# Patient Record
Sex: Male | Born: 2000 | Race: White | Hispanic: No | Marital: Single | Smoking: Never smoker
Health system: Southern US, Community
[De-identification: ages and names within clinical notes are randomized; demographics above are authoritative.]

## PROBLEM LIST (undated history)

## (undated) DIAGNOSIS — Z789 Other specified health status: Secondary | ICD-10-CM

---
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
## 2021-04-23 ENCOUNTER — Emergency Department (HOSPITAL_BASED_OUTPATIENT_CLINIC_OR_DEPARTMENT_OTHER)
Admission: EM | Admit: 2021-04-23 | Discharge: 2021-04-23 | Disposition: A | Discharge status: Home / Self Care | Payer: Commercial Managed Care - PPO | Attending: Emergency Medicine | Admitting: Emergency Medicine

## 2021-04-23 ENCOUNTER — Encounter (HOSPITAL_BASED_OUTPATIENT_CLINIC_OR_DEPARTMENT_OTHER): Payer: Self-pay

## 2021-04-23 ENCOUNTER — Emergency Department (HOSPITAL_BASED_OUTPATIENT_CLINIC_OR_DEPARTMENT_OTHER): Payer: Commercial Managed Care - PPO

## 2021-04-23 ENCOUNTER — Other Ambulatory Visit: Payer: Self-pay

## 2021-04-23 DIAGNOSIS — S83005A Unspecified dislocation of left patella, initial encounter: Secondary | ICD-10-CM | POA: Diagnosis not present

## 2021-04-23 DIAGNOSIS — M25562 Pain in left knee: Secondary | ICD-10-CM | POA: Diagnosis not present

## 2021-04-23 DIAGNOSIS — M25462 Effusion, left knee: Secondary | ICD-10-CM

## 2021-04-23 DIAGNOSIS — Y9355 Activity, bike riding: Secondary | ICD-10-CM | POA: Insufficient documentation

## 2021-04-23 DIAGNOSIS — Y92410 Unspecified street and highway as the place of occurrence of the external cause: Secondary | ICD-10-CM | POA: Diagnosis not present

## 2021-04-23 DIAGNOSIS — S8992XA Unspecified injury of left lower leg, initial encounter: Secondary | ICD-10-CM | POA: Diagnosis present

## 2021-04-23 MED ORDER — FENTANYL CITRATE (PF) 100 MCG/2ML IJ SOLN
INTRAMUSCULAR | Status: AC
  Administered 2021-04-23: 50 ug via INTRAVENOUS
  Filled 2021-04-23: qty 2

## 2021-04-23 MED ORDER — FENTANYL CITRATE (PF) 100 MCG/2ML IJ SOLN: 50.0000 ug | Freq: Once | INTRAMUSCULAR | Status: AC
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
## 2021-04-23 NOTE — Discharge Instructions (Addendum)
Please use knee immobilizer.  Please use Tylenol or ibuprofen for pain.  You may use 600 mg ibuprofen every 6 hours or 1000 mg of Tylenol every 6 hours.  You may choose to alternate between the 2.  This would be most effective.  Not to exceed 4 g of Tylenol within 24 hours.  Not to exceed 3200 mg ibuprofen 24 hours.   Please call Dr. Eliberto Ivory office and make an appointment within the next week.  Rest ice and elevate
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
## 2021-04-30 ENCOUNTER — Other Ambulatory Visit: Payer: Self-pay

## 2021-04-30 ENCOUNTER — Ambulatory Visit: Payer: Self-pay

## 2021-04-30 ENCOUNTER — Ambulatory Visit: Payer: Commercial Managed Care - PPO | Admitting: Orthopaedic Surgery

## 2021-04-30 DIAGNOSIS — M25462 Effusion, left knee: Secondary | ICD-10-CM | POA: Diagnosis not present

## 2021-04-30 DIAGNOSIS — S83005D Unspecified dislocation of left patella, subsequent encounter: Secondary | ICD-10-CM

## 2021-04-30 DIAGNOSIS — M25562 Pain in left knee: Secondary | ICD-10-CM
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
## 2021-05-15 ENCOUNTER — Ambulatory Visit: Payer: Commercial Managed Care - PPO | Admitting: Physician Assistant

## 2021-05-15 DIAGNOSIS — S83005D Unspecified dislocation of left patella, subsequent encounter: Secondary | ICD-10-CM | POA: Diagnosis not present

## 2021-05-15 NOTE — Progress Notes (Signed)
   Office Visit Note   Patient: [name]           Date of Birth: 12/12/2000           MRN: [id number] Visit Date: 05/15/2021              Requested by: Loyola Mast, MD [address] [place],  [place] [address] PCP: Loyola Mast, MD   Assessment & Plan: Visit Diagnoses:  1. Patellar dislocation, left, subsequent encounter     Plan: We will send him to formal physical therapy for quad strengthening.  He is also placed in a patella stabilizing knee brace.  He is wearing his brace whenever he is up.  Knee was aspirated today in total 21cc was changed aspirate obtained patient tolerated well.  Follow-Up Instructions: Return in about 4 weeks (around 06/12/2021).   Orders:  Orders Placed This Encounter  Procedures   Ambulatory referral to Physical Therapy   No orders of the defined types were placed in this encounter.     Procedures: No procedures performed   Clinical Data: No additional findings.   Subjective: Chief Complaint  Patient presents with   Left Leg - Follow-up    HPI Leam returns today for follow-up of his left knee patellar dislocation.  He has been wearing a knee immobilizer with his dad he is company today states he still been becoming more more short of the knee and has not been using the knee brace as much.  Still has some discomfort in the knee.  He has had no additional dislocations in the knee.  Review of Systems See HPI  Objective: Vital Signs: There were no vitals taken for this visit.  Physical Exam Constitutional:      Appearance: He is not ill-appearing or diaphoretic.  Pulmonary:     Effort: Pulmonary effort is normal.  Neurological:     Mental Status: He is alert and oriented to person, place, and time.  Psychiatric:        Mood and Affect: Mood normal.    Ortho Exam Left knee slight effusion.  No abnormal warmth.  No gross instability valgus varus stressing anterior drawer is negative.  Apprehension test is negative.  Full  extension.  Able to do straight leg raise but definite quad weakness. Specialty Comments:  No specialty comments available.  Imaging: No results found.   PMFS History: Patient Active Problem List   Diagnosis Date Noted   Retained orthopedic hardware right femur 08/15/2013   Fracture, right femur closed, shaft 01/13/2013   Past Medical History:  Diagnosis Date   Medical history non-contributory     Family History  Problem Relation Age of Onset   Hypertension Father    COPD Father     Past Surgical History:  Procedure Laterality Date   HARDWARE REMOVAL Right 08/15/2013   Procedure: REMOVAL OF ROD AND SCREWS RIGHT FEMUR;  Surgeon: Kathryne Hitch, MD;  Location: [facility] OR;  Service: Orthopedics;  Laterality: Right;   ORIF FEMUR FRACTURE Right 01/13/2013   Procedure: OPEN REDUCTION INTERNAL FIXATION Femur Fracture;  Surgeon: Kathryne Hitch, MD;  Location: [facility] OR;  Service: Orthopedics;  Laterality: Right;   Social History   Occupational History   Not on file  Tobacco Use   Smoking status: Never   Smokeless tobacco: Never  Substance and Sexual Activity   Alcohol use: No   Drug use: No   Sexual activity: Never

## 2021-06-04 ENCOUNTER — Other Ambulatory Visit: Payer: Self-pay

## 2021-06-04 ENCOUNTER — Ambulatory Visit: Payer: Commercial Managed Care - PPO | Attending: Physician Assistant | Admitting: Physical Therapy

## 2021-06-04 ENCOUNTER — Encounter: Payer: Self-pay | Admitting: Physical Therapy

## 2021-06-04 DIAGNOSIS — R252 Cramp and spasm: Secondary | ICD-10-CM | POA: Diagnosis present

## 2021-06-04 DIAGNOSIS — M25562 Pain in left knee: Secondary | ICD-10-CM | POA: Diagnosis not present

## 2021-06-04 DIAGNOSIS — R600 Localized edema: Secondary | ICD-10-CM | POA: Diagnosis present

## 2021-06-04 DIAGNOSIS — R262 Difficulty in walking, not elsewhere classified: Secondary | ICD-10-CM | POA: Insufficient documentation

## 2021-06-04 DIAGNOSIS — M6281 Muscle weakness (generalized): Secondary | ICD-10-CM | POA: Diagnosis present

## 2021-06-04 DIAGNOSIS — M25662 Stiffness of left knee, not elsewhere classified: Secondary | ICD-10-CM | POA: Diagnosis present

## 2021-06-04 NOTE — Patient Instructions (Signed)
Access Code: [email or web address] URL: [email or web address] Date: 06/04/2021 Prepared by: Harrie Foreman  Exercises Supine Quad Set - 1 x daily - 7 x weekly - 3 sets - 10 reps Active Straight Leg Raise with Quad Set - 1 x daily - 7 x weekly - 3 sets - 10 reps Seated Quad Set - 1 x daily - 7 x weekly - 3 sets - 10 reps Prone Hamstring Curl with Ankle Weight Table - 1 x daily - 7 x weekly - 3 sets - 10 reps Seated Hamstring Stretch - 1 x daily - 7 x weekly - 3 sets - 10 reps Standing Heel Raise with Support - 1 x daily - 7 x weekly - 3 sets - 10 reps
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
## 2021-06-06 ENCOUNTER — Ambulatory Visit: Payer: Commercial Managed Care - PPO

## 2021-06-06 ENCOUNTER — Other Ambulatory Visit: Payer: Self-pay

## 2021-06-06 DIAGNOSIS — R252 Cramp and spasm: Secondary | ICD-10-CM

## 2021-06-06 DIAGNOSIS — M25662 Stiffness of left knee, not elsewhere classified: Secondary | ICD-10-CM

## 2021-06-06 DIAGNOSIS — R262 Difficulty in walking, not elsewhere classified: Secondary | ICD-10-CM

## 2021-06-06 DIAGNOSIS — M25562 Pain in left knee: Secondary | ICD-10-CM | POA: Diagnosis not present

## 2021-06-06 DIAGNOSIS — M6281 Muscle weakness (generalized): Secondary | ICD-10-CM

## 2021-06-06 DIAGNOSIS — R600 Localized edema: Secondary | ICD-10-CM

## 2021-06-06 NOTE — Therapy (Signed)
[facility] [address]  [address] [place], [place], [address] Phone: [phone]   Fax:  [phone]  Physical Therapy Treatment  Patient Details  Name: [name] MRN: [id number] Date of Birth: 04-22-2001 Referring Provider (PT): Richardean Canal   Encounter Date: 06/06/2021   PT End of Session - 06/06/21 1024     Visit Number 2    Number of Visits 12    Date for PT Re-Evaluation 07/16/21    PT Start Time 0933    PT Stop Time 1013    PT Time Calculation (min) 40 min    Equipment Utilized During Treatment Left knee immobilizer    Activity Tolerance Patient tolerated treatment well    Behavior During Therapy [facility] for tasks assessed/performed             Past Medical History:  Diagnosis Date   Medical history non-contributory     Past Surgical History:  Procedure Laterality Date   HARDWARE REMOVAL Right 08/15/2013   Procedure: REMOVAL OF ROD AND SCREWS RIGHT FEMUR;  Surgeon: Kathryne Hitch, MD;  Location: [facility] OR;  Service: Orthopedics;  Laterality: Right;   ORIF FEMUR FRACTURE Right 01/13/2013   Procedure: OPEN REDUCTION INTERNAL FIXATION Femur Fracture;  Surgeon: Kathryne Hitch, MD;  Location: [facility] OR;  Service: Orthopedics;  Laterality: Right;    There were no vitals filed for this visit.   Subjective Assessment - 06/06/21 0935     Subjective Pt reports that he has been wearing his brace at all times, notes occasional aches in his knee in shower but overall no complaints so far.    Pertinent History history R femur fx 2014    Patient Stated Goals get the knee back to 100%    Currently in Pain? No/denies                               [facility] Adult PT Treatment/Exercise - 06/06/21 0001       Exercises   Exercises Knee/Hip      Knee/Hip Exercises: Stretches   Active Hamstring Stretch Left;2 reps;30 seconds    Active Hamstring Stretch Limitations seated    Quad Stretch  Left;2 reps;30 seconds    Quad Stretch Limitations supine with strap      Knee/Hip Exercises: Aerobic   Recumbent Bike L1x80min      Knee/Hip Exercises: Standing   Heel Raises Both;10 reps    Hip Abduction Stengthening;Both;10 reps    Abduction Limitations ski pole assist    Hip Extension Stengthening;Both;10 reps    Extension Limitations ski pole assist      Knee/Hip Exercises: Seated   Long Arc Quad AROM;15 reps    Long Arc Quad Limitations with ankle pump to decrease swelling      Knee/Hip Exercises: Supine   Quad Sets Strengthening;Left;10 reps    Quad Sets Limitations towel under heel    Straight Leg Raises Strengthening;Left;10 reps    Straight Leg Raises Limitations cues for QS with each rep      Knee/Hip Exercises: Prone   Hamstring Curl 10 reps    Hamstring Curl Limitations with red TB                     PT Education - 06/06/21 1017     Education Details HEP update: Access Code: [email or web address]    Person(s) Educated Patient    Methods Explanation;Demonstration;Handout  Comprehension Verbalized understanding;Returned demonstration              PT Short Term Goals - 06/06/21 1156       PT SHORT TERM GOAL #1   Title independent with initial HEP    Time 2    Period Weeks    Status On-going    Target Date 06/18/21               PT Long Term Goals - 06/06/21 1156       PT LONG TERM GOAL #1   Title Independent with progressed HEP to improve outcomes.    Time 6    Period Weeks    Status On-going      PT LONG TERM GOAL #2   Title Patient will tolerate walking for 30 minutes without complaint of L knee pain.    Baseline increased pain 10 min walking    Time 6    Period Weeks    Status On-going      PT LONG TERM GOAL #3   Title Pt. will demonstrated full L knee ROM = R knee ROM (0-145) to improve gait mechanics.    Baseline 5-130    Time 6    Period Weeks    Status On-going      PT LONG TERM GOAL #4   Title Patient will be able to  return to all activities without limitation.    Time 6    Period Weeks    Status On-going                   Plan - 06/06/21 1025     Clinical Impression Statement Pt had no complaints with the exercises today. Showed a good demonstration of the exercises with intermittent cues given for form and technique. He demonstrated some L quad tightness so added supine quad stretch to HEP. He reported that his exercises were a bit challenging for him so didn't update any strengthening exercises for now. He still wears his brace at all times and denies any episodes of pain or patellar dislocation.    PT Frequency 2x / week    PT Duration 6 weeks    PT Treatment/Interventions ADLs/Self Care Home Management;Cryotherapy;Electrical Stimulation;Iontophoresis 4mg /ml Dexamethasone;Moist Heat;Gait training;Functional mobility training;Therapeutic activities;Therapeutic exercise;Neuromuscular re-education;Manual techniques;Dry needling;Taping;Vasopneumatic Device;Passive range of motion;Joint Manipulations    PT Next Visit Plan review and progress HEP standing exercises    PT Home Exercise Plan Access Code:    Consulted and Agree with Plan of Care Patient             Patient will benefit from skilled therapeutic intervention in order to improve the following deficits and impairments:  Decreased endurance, Decreased range of motion, Decreased strength, Pain, Impaired flexibility, Difficulty walking  Visit Diagnosis: Acute pain of left knee  Stiffness of left knee, not elsewhere classified  Cramp and spasm  Difficulty in walking, not elsewhere classified  Localized edema  Muscle weakness (generalized)     Problem List Patient Active Problem List   Diagnosis Date Noted   Patellar dislocation, left, subsequent encounter 05/15/2021   Retained orthopedic hardware right femur 08/15/2013   Fracture, right femur closed, shaft 01/13/2013    01/15/2013, PTA 06/06/2021, 11:57  AM  [facility] [address]  [address] [place], [place], [place] Phone: [phone]   Fax:  [phone]  Name: [name] MRN: Nickolas Madrid Date of Birth: [birth year]

## 2021-06-10 ENCOUNTER — Other Ambulatory Visit: Payer: Self-pay

## 2021-06-10 ENCOUNTER — Ambulatory Visit: Payer: Commercial Managed Care - PPO | Admitting: Physical Therapy

## 2021-06-10 ENCOUNTER — Encounter: Payer: Self-pay | Admitting: Physical Therapy

## 2021-06-10 DIAGNOSIS — M25662 Stiffness of left knee, not elsewhere classified: Secondary | ICD-10-CM

## 2021-06-10 DIAGNOSIS — M6281 Muscle weakness (generalized): Secondary | ICD-10-CM

## 2021-06-10 DIAGNOSIS — M25562 Pain in left knee: Secondary | ICD-10-CM

## 2021-06-10 DIAGNOSIS — R600 Localized edema: Secondary | ICD-10-CM

## 2021-06-10 DIAGNOSIS — R252 Cramp and spasm: Secondary | ICD-10-CM

## 2021-06-10 DIAGNOSIS — R262 Difficulty in walking, not elsewhere classified: Secondary | ICD-10-CM

## 2021-06-10 NOTE — Patient Instructions (Signed)
Access Code: [email or web address] URL: [email or web address] Date: 06/10/2021 Prepared by: Harrie Foreman  Exercises Supine Bridge with [employer] Between Knees - 1 x daily - 7 x weekly - 3 sets - 10 reps Sidelying Hip Abduction - 1 x daily - 7 x weekly - 3 sets - 10 reps Prone Hip Extension with Plantarflexion - 1 x daily - 7 x weekly - 3 sets - 10 reps Supine Active Straight Leg Raise - 1 x daily - 7 x weekly - 3 sets - 10 reps Seated Hamstring Stretch - 2 x daily - 7 x weekly - 1 sets - 3 reps - 30 sec hold

## 2021-06-10 NOTE — Therapy (Signed)
[facility] [address]  [place], [place], [address] Phone: [phone]   Fax:  [phone]  Physical Therapy Treatment  Patient Details  Name: [name] MRN: [id number] Date of Birth: [birth year] Referring Provider (PT): Erskine Emery   Encounter Date: 06/10/2021   PT End of Session - 06/10/21 1800     Visit Number 3    Number of Visits 12    Date for PT Re-Evaluation 07/16/21    PT Start Time 1620    PT Stop Time 1700    PT Time Calculation (min) 40 min    Activity Tolerance Patient tolerated treatment well    Behavior During Therapy [facility] for tasks assessed/performed             Past Medical History:  Diagnosis Date   Medical history non-contributory     Past Surgical History:  Procedure Laterality Date   HARDWARE REMOVAL Right 08/15/2013   Procedure: REMOVAL OF ROD AND SCREWS RIGHT FEMUR;  Surgeon: Mcarthur Rossetti, MD;  Location: [place];  Service: Orthopedics;  Laterality: Right;   ORIF FEMUR FRACTURE Right 01/13/2013   Procedure: OPEN REDUCTION INTERNAL FIXATION Femur Fracture;  Surgeon: Mcarthur Rossetti, MD;  Location: [place];  Service: Orthopedics;  Laterality: Right;    There were no vitals filed for this visit.   Subjective Assessment - 06/10/21 1623     Subjective Pt reports that is compliant with wearing brace, but did not wear today to therapy since didn't want to bother taking it on and off.    Pertinent History history R femur fx 2014    Patient Stated Goals get the knee back to 100%    Currently in Pain? No/denies                               [facility] Adult PT Treatment/Exercise - 06/10/21 0001       Exercises   Exercises Knee/Hip      Knee/Hip Exercises: Aerobic   Nustep x 6 min      Knee/Hip Exercises: Standing   Hip Abduction Stengthening;Both;20 reps;Knee straight    Abduction Limitations ski pole assist    Hip Extension  Stengthening;Both;15 reps;Knee straight    Extension Limitations ski pole assist    Forward Step Up 2 sets;10 reps;Hand Hold: 1;Step Height: 2";Both    Step Down 10 reps;Hand Hold: 1;Step Height: 2";Both    Step Down Limitations dips      Knee/Hip Exercises: Seated   Long Arc Quad Strengthening;Left;10 reps    Other Seated Knee/Hip Exercises single leg raise with quad set 2 x 10 LLE - challenging    Other Seated Knee/Hip Exercises seated hamstring stretch 2 x 30 sec bil      Knee/Hip Exercises: Supine   Straight Leg Raises Strengthening;Left;10 reps;2 sets    Straight Leg Raises Limitations cues for QS with each rep      Knee/Hip Exercises: Sidelying   Hip ABduction Strengthening;Left;2 sets;10 reps      Knee/Hip Exercises: Prone   Hip Extension Strengthening;Both;2 sets;10 reps                     PT Education - 06/10/21 1700     Education Details HEP update    Person(s) Educated Patient    Methods Explanation;Demonstration;Verbal cues;Handout    Comprehension Verbalized understanding;Returned demonstration  PT Short Term Goals - 06/10/21 1802       PT SHORT TERM GOAL #1   Title independent with initial HEP    Time 2    Period Weeks    Status Achieved   06/10/21- met for current   Target Date 06/18/21               PT Long Term Goals - 06/10/21 1803       PT LONG TERM GOAL #1   Title Independent with progressed HEP to improve outcomes.    Time 6    Period Weeks    Status On-going      PT LONG TERM GOAL #2   Title Patient will tolerate walking for 30 minutes without complaint of L knee pain.    Baseline increased pain 10 min walking    Time 6    Period Weeks    Status On-going      PT LONG TERM GOAL #3   Title Pt. will demonstrated full L knee ROM = R knee ROM (0-145) to improve gait mechanics.    Baseline 5-130    Time 6    Period Weeks    Status On-going   06/10/18- full extension, still 130 deg flexion L knee     PT  LONG TERM GOAL #4   Title Patient will be able to return to all activities without limitation.    Time 6    Period Weeks    Status On-going                   Plan - 06/10/21 1800     Clinical Impression Statement Patient is making good progress and reporting no knee pain with exercises.  He reports good compliance with current exercises and feels they are much easier.  He demonstrates improved L quad strength with no knee lag with LAQ, however obviously fatigued quickly with SLR both supine and sitting.  Progressed HEP today to continue progressive knee/hip strengthening.  He would benefit from continued skilled therapy.    PT Frequency 2x / week    PT Duration 6 weeks    PT Treatment/Interventions ADLs/Self Care Home Management;Cryotherapy;Electrical Stimulation;Iontophoresis 4mg /ml Dexamethasone;Moist Heat;Gait training;Functional mobility training;Therapeutic activities;Therapeutic exercise;Neuromuscular re-education;Manual techniques;Dry needling;Taping;Vasopneumatic Device;Passive range of motion;Joint Manipulations    PT Next Visit Plan review and progress HEP standing exercises    PT Home Exercise Plan Access Code: [email or web address]    Consulted and Agree with Plan of Care Patient             Patient will benefit from skilled therapeutic intervention in order to improve the following deficits and impairments:  Decreased endurance, Decreased range of motion, Decreased strength, Pain, Impaired flexibility, Difficulty walking  Visit Diagnosis: Acute pain of left knee  Stiffness of left knee, not elsewhere classified  Cramp and spasm  Difficulty in walking, not elsewhere classified  Localized edema  Muscle weakness (generalized)     Problem List Patient Active Problem List   Diagnosis Date Noted   Patellar dislocation, left, subsequent encounter 05/15/2021   Retained orthopedic hardware right femur 08/15/2013   Fracture, right femur closed, shaft 01/13/2013     Rennie Natter, PT, DPT 06/10/2021, 6:04 PM  [facility] [address]  [place], [place], [address] Phone: [phone]   Fax:  [phone]  Name: Jemar Paulsen MRN: [id number] Date of Birth: 26-Jun-2001

## 2021-06-12 ENCOUNTER — Other Ambulatory Visit: Payer: Self-pay

## 2021-06-12 ENCOUNTER — Ambulatory Visit: Payer: Commercial Managed Care - PPO

## 2021-06-12 ENCOUNTER — Ambulatory Visit (INDEPENDENT_AMBULATORY_CARE_PROVIDER_SITE_OTHER): Payer: Commercial Managed Care - PPO | Admitting: Orthopaedic Surgery

## 2021-06-12 ENCOUNTER — Encounter: Payer: Self-pay | Admitting: Orthopaedic Surgery

## 2021-06-12 DIAGNOSIS — R262 Difficulty in walking, not elsewhere classified: Secondary | ICD-10-CM

## 2021-06-12 DIAGNOSIS — M25562 Pain in left knee: Secondary | ICD-10-CM | POA: Diagnosis not present

## 2021-06-12 DIAGNOSIS — R600 Localized edema: Secondary | ICD-10-CM

## 2021-06-12 DIAGNOSIS — S83005D Unspecified dislocation of left patella, subsequent encounter: Secondary | ICD-10-CM | POA: Diagnosis not present

## 2021-06-12 DIAGNOSIS — R252 Cramp and spasm: Secondary | ICD-10-CM

## 2021-06-12 DIAGNOSIS — M25662 Stiffness of left knee, not elsewhere classified: Secondary | ICD-10-CM

## 2021-06-12 DIAGNOSIS — M6281 Muscle weakness (generalized): Secondary | ICD-10-CM

## 2021-06-12 NOTE — Therapy (Signed)
[facility] [address]  [place], [place], [address] Phone: [phone]   Fax:  [phone]  Physical Therapy Treatment  Patient Details  Name: [name] MRN: [id number] Date of Birth: [birth year] Referring Provider (PT): Erskine Emery   Encounter Date: 06/12/2021   PT End of Session - 06/12/21 1017     Visit Number 4    Number of Visits 12    Date for PT Re-Evaluation 07/16/21    PT Start Time 0928    PT Stop Time 1012    PT Time Calculation (min) 44 min    Activity Tolerance Patient tolerated treatment well    Behavior During Therapy [facility] for tasks assessed/performed             Past Medical History:  Diagnosis Date   Medical history non-contributory     Past Surgical History:  Procedure Laterality Date   HARDWARE REMOVAL Right 08/15/2013   Procedure: REMOVAL OF ROD AND SCREWS RIGHT FEMUR;  Surgeon: Mcarthur Rossetti, MD;  Location: [place];  Service: Orthopedics;  Laterality: Right;   ORIF FEMUR FRACTURE Right 01/13/2013   Procedure: OPEN REDUCTION INTERNAL FIXATION Femur Fracture;  Surgeon: Mcarthur Rossetti, MD;  Location: [place];  Service: Orthopedics;  Laterality: Right;    There were no vitals filed for this visit.   Subjective Assessment - 06/12/21 0931     Subjective Pt reports no pain in his L knee and no recent episodes of dislocation.    Pertinent History history R femur fx 2014    Patient Stated Goals get the knee back to 100%    Currently in Pain? No/denies                               [facility] Adult PT Treatment/Exercise - 06/12/21 0001       Exercises   Exercises Knee/Hip      Knee/Hip Exercises: Stretches   Quad Stretch Left;2 reps;30 seconds    Quad Stretch Limitations prone with strap with hip extended      Knee/Hip Exercises: Aerobic   Recumbent Bike L4x38mn      Knee/Hip Exercises: Standing   Hip Abduction Stengthening;Both;2 sets;10  reps    Abduction Limitations red TB looped aoround knees with UE support    Hip Extension Stengthening;Both;10 reps;2 sets    Extension Limitations red TB looped aoround knees with UE support    Functional Squat 2 sets;10 reps    Functional Squat Limitations mini squats with TRX    SLS 3x15"; very difficult for him past 15 sec      Knee/Hip Exercises: Seated   Long Arc Quad Strengthening;Both;2 sets;10 reps    Long Arc Quad Limitations with ball squeezing    Hamstring Curl Strengthening;Left;2 sets;10 reps    Hamstring Limitations red TB      Knee/Hip Exercises: Prone   Hamstring Curl 10 reps    Hamstring Curl Limitations with 2# weight    Hip Extension Strengthening;Both;10 reps    Hip Extension Limitations with knee bent                       PT Short Term Goals - 06/10/21 1802       PT SHORT TERM GOAL #1   Title independent with initial HEP    Time 2    Period Weeks    Status Achieved  06/10/21- met for current   Target Date 06/18/21               PT Long Term Goals - 06/10/21 1803       PT LONG TERM GOAL #1   Title Independent with progressed HEP to improve outcomes.    Time 6    Period Weeks    Status On-going      PT LONG TERM GOAL #2   Title Patient will tolerate walking for 30 minutes without complaint of L knee pain.    Baseline increased pain 10 min walking    Time 6    Period Weeks    Status On-going      PT LONG TERM GOAL #3   Title Pt. will demonstrated full L knee ROM = R knee ROM (0-145) to improve gait mechanics.    Baseline 5-130    Time 6    Period Weeks    Status On-going   06/10/18- full extension, still 130 deg flexion L knee     PT LONG TERM GOAL #4   Title Patient will be able to return to all activities without limitation.    Time 6    Period Weeks    Status On-going                   Plan - 06/12/21 1012     Clinical Impression Statement Pt had no complaints with the exercises today. Able to  progress ther ex and incorporated mini squats to session to avoid stress on the knees. He has difficulty with L SLS and could use some balance training on one leg for stability. He demonstrates glute weakness with exercises which could contribute to his tendency to ER his feet with squatting. Pt denied any modalities post session and progressing well.    PT Frequency 2x / week    PT Duration 6 weeks    PT Treatment/Interventions ADLs/Self Care Home Management;Cryotherapy;Electrical Stimulation;Iontophoresis 67m/ml Dexamethasone;Moist Heat;Gait training;Functional mobility training;Therapeutic activities;Therapeutic exercise;Neuromuscular re-education;Manual techniques;Dry needling;Taping;Vasopneumatic Device;Passive range of motion;Joint Manipulations    PT Next Visit Plan review and progress HEP standing exercises; SLS exercises    PT Home Exercise Plan Access Code: [email or web address]   Consulted and Agree with Plan of Care Patient             Patient will benefit from skilled therapeutic intervention in order to improve the following deficits and impairments:  Decreased endurance, Decreased range of motion, Decreased strength, Pain, Impaired flexibility, Difficulty walking  Visit Diagnosis: Acute pain of left knee  Stiffness of left knee, not elsewhere classified  Cramp and spasm  Difficulty in walking, not elsewhere classified  Localized edema  Muscle weakness (generalized)     Problem List Patient Active Problem List   Diagnosis Date Noted   Patellar dislocation, left, subsequent encounter 05/15/2021   Retained orthopedic hardware right femur 08/15/2013   Fracture, right femur closed, shaft 01/13/2013    BArtist Pais PTA 06/12/2021, 10:19 AM  [facility] [place] [address]Phone: [phone]  Fax:  [phone] Name: ZJandel PatriarcaMRN: [id number] of Birth: [birth year]

## 2021-06-12 NOTE — Progress Notes (Signed)
[name] is now 7-week status post an acute dislocation of his left patella.  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  This was a traumatic dislocation and he has never had a dislocation before that.  He has been wearing a patella stabilizing brace.  He has had 4 physical therapy sessions as well now.  And examined both knees at the same time, I am able to easily sublux the patella as with no apprehension regarding.  The knees both feel stable and the patella seems to be tracking much better.  There is still some swelling with his left knee but it does feel stable overall.  From my standpoint he can ease back into riding but he is going to be wearing a knee brace when he rides.  His dad is ordered some protective knee braces that a lot of motocross rider as well.  He will continue therapy as well.  From my standpoint I will see him back in 3 months at that visit I would like sunrise view of the right patella/knee.

## 2021-06-16 ENCOUNTER — Encounter: Payer: Self-pay | Admitting: Physical Therapy

## 2021-06-16 ENCOUNTER — Other Ambulatory Visit: Payer: Self-pay

## 2021-06-16 ENCOUNTER — Ambulatory Visit: Payer: Commercial Managed Care - PPO | Admitting: Physical Therapy

## 2021-06-16 DIAGNOSIS — M25562 Pain in left knee: Secondary | ICD-10-CM

## 2021-06-16 DIAGNOSIS — R600 Localized edema: Secondary | ICD-10-CM

## 2021-06-16 DIAGNOSIS — R252 Cramp and spasm: Secondary | ICD-10-CM

## 2021-06-16 DIAGNOSIS — M25662 Stiffness of left knee, not elsewhere classified: Secondary | ICD-10-CM

## 2021-06-16 DIAGNOSIS — M6281 Muscle weakness (generalized): Secondary | ICD-10-CM

## 2021-06-16 DIAGNOSIS — R262 Difficulty in walking, not elsewhere classified: Secondary | ICD-10-CM
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
## 2021-06-18 ENCOUNTER — Encounter: Payer: Self-pay | Admitting: Physical Therapy

## 2021-06-18 ENCOUNTER — Other Ambulatory Visit: Payer: Self-pay

## 2021-06-18 ENCOUNTER — Ambulatory Visit: Payer: Commercial Managed Care - PPO | Admitting: Physical Therapy

## 2021-06-18 DIAGNOSIS — M25662 Stiffness of left knee, not elsewhere classified: Secondary | ICD-10-CM

## 2021-06-18 DIAGNOSIS — M25562 Pain in left knee: Secondary | ICD-10-CM

## 2021-06-18 DIAGNOSIS — R252 Cramp and spasm: Secondary | ICD-10-CM

## 2021-06-18 DIAGNOSIS — R262 Difficulty in walking, not elsewhere classified: Secondary | ICD-10-CM

## 2021-06-18 DIAGNOSIS — R600 Localized edema: Secondary | ICD-10-CM

## 2021-06-18 DIAGNOSIS — M6281 Muscle weakness (generalized): Secondary | ICD-10-CM
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
## 2021-06-23 ENCOUNTER — Other Ambulatory Visit: Payer: Self-pay

## 2021-06-23 ENCOUNTER — Ambulatory Visit: Payer: Commercial Managed Care - PPO | Admitting: Physical Therapy

## 2021-06-23 ENCOUNTER — Encounter: Payer: Self-pay | Admitting: Physical Therapy

## 2021-06-23 DIAGNOSIS — M6281 Muscle weakness (generalized): Secondary | ICD-10-CM

## 2021-06-23 DIAGNOSIS — R262 Difficulty in walking, not elsewhere classified: Secondary | ICD-10-CM

## 2021-06-23 DIAGNOSIS — M25662 Stiffness of left knee, not elsewhere classified: Secondary | ICD-10-CM

## 2021-06-23 DIAGNOSIS — M25562 Pain in left knee: Secondary | ICD-10-CM

## 2021-06-23 DIAGNOSIS — R600 Localized edema: Secondary | ICD-10-CM

## 2021-06-23 DIAGNOSIS — R252 Cramp and spasm: Secondary | ICD-10-CM
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
## 2021-06-25 ENCOUNTER — Ambulatory Visit: Payer: Commercial Managed Care - PPO | Attending: Physician Assistant

## 2021-06-25 ENCOUNTER — Other Ambulatory Visit: Payer: Self-pay

## 2021-06-25 DIAGNOSIS — R262 Difficulty in walking, not elsewhere classified: Secondary | ICD-10-CM | POA: Insufficient documentation

## 2021-06-25 DIAGNOSIS — R252 Cramp and spasm: Secondary | ICD-10-CM | POA: Insufficient documentation

## 2021-06-25 DIAGNOSIS — M25662 Stiffness of left knee, not elsewhere classified: Secondary | ICD-10-CM | POA: Insufficient documentation

## 2021-06-25 DIAGNOSIS — R600 Localized edema: Secondary | ICD-10-CM | POA: Diagnosis present

## 2021-06-25 DIAGNOSIS — M6281 Muscle weakness (generalized): Secondary | ICD-10-CM | POA: Insufficient documentation

## 2021-06-25 DIAGNOSIS — M25562 Pain in left knee: Secondary | ICD-10-CM | POA: Diagnosis present
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
## 2021-06-30 ENCOUNTER — Encounter: Payer: Self-pay | Admitting: Physical Therapy

## 2021-06-30 ENCOUNTER — Ambulatory Visit: Payer: Commercial Managed Care - PPO | Admitting: Physical Therapy

## 2021-06-30 ENCOUNTER — Other Ambulatory Visit: Payer: Self-pay

## 2021-06-30 DIAGNOSIS — R252 Cramp and spasm: Secondary | ICD-10-CM

## 2021-06-30 DIAGNOSIS — M25562 Pain in left knee: Secondary | ICD-10-CM | POA: Diagnosis not present

## 2021-06-30 DIAGNOSIS — R262 Difficulty in walking, not elsewhere classified: Secondary | ICD-10-CM

## 2021-06-30 DIAGNOSIS — M25662 Stiffness of left knee, not elsewhere classified: Secondary | ICD-10-CM

## 2021-06-30 DIAGNOSIS — M6281 Muscle weakness (generalized): Secondary | ICD-10-CM

## 2021-06-30 DIAGNOSIS — R600 Localized edema: Secondary | ICD-10-CM

## 2021-06-30 NOTE — Patient Instructions (Signed)
Access Code: [email or web address] URL: [email or web address] Date: 06/30/2021 Prepared by: Harrie Foreman  Exercises The Diver - 1 x daily - 7 x weekly - 2 sets - 10 reps
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
## 2021-07-02 ENCOUNTER — Other Ambulatory Visit: Payer: Self-pay

## 2021-07-02 ENCOUNTER — Ambulatory Visit: Payer: Commercial Managed Care - PPO

## 2021-07-02 DIAGNOSIS — M25562 Pain in left knee: Secondary | ICD-10-CM | POA: Diagnosis not present

## 2021-07-02 DIAGNOSIS — R600 Localized edema: Secondary | ICD-10-CM

## 2021-07-02 DIAGNOSIS — M25662 Stiffness of left knee, not elsewhere classified: Secondary | ICD-10-CM

## 2021-07-02 DIAGNOSIS — M6281 Muscle weakness (generalized): Secondary | ICD-10-CM

## 2021-07-02 DIAGNOSIS — R252 Cramp and spasm: Secondary | ICD-10-CM

## 2021-07-02 DIAGNOSIS — R262 Difficulty in walking, not elsewhere classified: Secondary | ICD-10-CM
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
## 2021-07-07 ENCOUNTER — Other Ambulatory Visit: Payer: Self-pay

## 2021-07-07 ENCOUNTER — Ambulatory Visit: Payer: Commercial Managed Care - PPO

## 2021-07-07 DIAGNOSIS — M25662 Stiffness of left knee, not elsewhere classified: Secondary | ICD-10-CM

## 2021-07-07 DIAGNOSIS — R262 Difficulty in walking, not elsewhere classified: Secondary | ICD-10-CM

## 2021-07-07 DIAGNOSIS — M6281 Muscle weakness (generalized): Secondary | ICD-10-CM

## 2021-07-07 DIAGNOSIS — R600 Localized edema: Secondary | ICD-10-CM

## 2021-07-07 DIAGNOSIS — M25562 Pain in left knee: Secondary | ICD-10-CM | POA: Diagnosis not present

## 2021-07-07 DIAGNOSIS — R252 Cramp and spasm: Secondary | ICD-10-CM
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
## 2021-07-09 ENCOUNTER — Encounter: Payer: Self-pay | Admitting: Physical Therapy

## 2021-07-09 ENCOUNTER — Ambulatory Visit: Payer: Commercial Managed Care - PPO | Admitting: Physical Therapy

## 2021-07-09 ENCOUNTER — Other Ambulatory Visit: Payer: Self-pay

## 2021-07-09 DIAGNOSIS — M6281 Muscle weakness (generalized): Secondary | ICD-10-CM

## 2021-07-09 DIAGNOSIS — M25662 Stiffness of left knee, not elsewhere classified: Secondary | ICD-10-CM

## 2021-07-09 DIAGNOSIS — R252 Cramp and spasm: Secondary | ICD-10-CM

## 2021-07-09 DIAGNOSIS — R600 Localized edema: Secondary | ICD-10-CM

## 2021-07-09 DIAGNOSIS — R262 Difficulty in walking, not elsewhere classified: Secondary | ICD-10-CM

## 2021-07-09 DIAGNOSIS — M25562 Pain in left knee: Secondary | ICD-10-CM

## 2021-07-09 NOTE — Patient Instructions (Signed)
Access Code: [email or web address] URL: [email or web address] Date: 07/09/2021 Prepared by: Harrie Foreman  Exercises Squat with Chair Touch - 1 x daily - 7 x weekly - 2 sets - 10 reps Bird Dog - 1 x daily - 7 x weekly - 3 sets - 10 reps Standard Plank - 1 x daily - 7 x weekly - 1 sets - 3 reps - 30 sec hold Side Plank on Knees - 1 x daily - 7 x weekly - 1 sets - 3 reps - 30 sec hold

## 2021-07-09 NOTE — Therapy (Signed)
[facility] [address]  [place], [place], [address] Phone: [phone]   Fax:  [phone]  Physical Therapy Treatment  PHYSICAL THERAPY DISCHARGE SUMMARY  Visits from Start of Care: 12  Current functional level related to goals / functional outcomes: 89% on FOTO, all goals met, has returned to all activities without limitation.    Remaining deficits: Slight tightness in R quad compared to L, difficulty with SLS activities (no different between L v. R)   Education / Equipment: Progressed HEP   Plan: Patient agrees to discharge.  Patient is being discharged due to meeting the stated rehab goals.       Patient Details  Name: [name] MRN: [id number] Date of Birth: Apr 06, 2001 Referring Provider (PT): Erskine Emery   Encounter Date: 07/09/2021   PT End of Session - 07/09/21 1106     Visit Number 12    Number of Visits 12    Date for PT Re-Evaluation 07/16/21    PT Start Time 1103    PT Stop Time 1145    PT Time Calculation (min) 42 min    Activity Tolerance Patient tolerated treatment well    Behavior During Therapy [facility] for tasks assessed/performed             Past Medical History:  Diagnosis Date   Medical history non-contributory     Past Surgical History:  Procedure Laterality Date   HARDWARE REMOVAL Right 08/15/2013   Procedure: REMOVAL OF ROD AND SCREWS RIGHT FEMUR;  Surgeon: Mcarthur Rossetti, MD;  Location: [facility];  Service: Orthopedics;  Laterality: Right;   ORIF FEMUR FRACTURE Right 01/13/2013   Procedure: OPEN REDUCTION INTERNAL FIXATION Femur Fracture;  Surgeon: Mcarthur Rossetti, MD;  Location: [facility];  Service: Orthopedics;  Laterality: Right;    There were no vitals filed for this visit.   Subjective Assessment - 07/09/21 1105     Subjective Patient reports he is doing well, no pain, feels like he has returned to PLOF and is ready for discharge.    Pertinent  History history R femur fx 2014    Patient Stated Goals get the knee back to 100%    Currently in Pain? No/denies                [facility] PT Assessment - 07/09/21 0001       Assessment   Medical Diagnosis Patellar dislocation, left    Referring Provider (PT) Erskine Emery    Onset Date/Surgical Date 06/23/21      Observation/Other Assessments   Focus on Therapeutic Outcomes (FOTO)  knee 89%      PROM   Left Knee Extension 0    Left Knee Flexion 143                           [facility] Adult PT Treatment/Exercise - 07/09/21 0001       Exercises   Exercises Knee/Hip      Knee/Hip Exercises: Aerobic   Elliptical L1 x 6 min      Knee/Hip Exercises: Standing   Functional Squat 2 sets;10 reps    Functional Squat Limitations table behind while cues for form    Other Standing Knee Exercises RDLs x 15 bil    Other Standing Knee Exercises tree pose x  15 sec bil      Knee/Hip Exercises: Sidelying   Other Sidelying Knee/Hip Exercises side planks on elbows  and knees 3 x 30 sec bil      Knee/Hip Exercises: Prone   Other Prone Exercises prone on elbows 3 x 30 sec bil    Other Prone Exercises bird dogs 2 x 10 bil                     PT Education - 07/09/21 1150     Education Details progressed HEP for core/hip strengthening focusing on body weight exercises.    Person(s) Educated Patient    Methods Explanation;Demonstration;Verbal cues;Handout    Comprehension Verbalized understanding;Returned demonstration              PT Short Term Goals - 06/10/21 1802       PT SHORT TERM GOAL #1   Title independent with initial HEP    Time 2    Period Weeks    Status Achieved   06/10/21- met for current   Target Date 06/18/21               PT Long Term Goals - 07/09/21 1106       PT LONG TERM GOAL #1   Title Independent with progressed HEP to improve outcomes.    Time 6    Period Weeks    Status Achieved   06/16/21 - met for current   11/16- met     PT LONG TERM GOAL #2   Title Patient will tolerate walking for 30 minutes without complaint of L knee pain.    Baseline increased pain 10 min walking    Time 6    Period Weeks    Status Achieved   06/16/21- no reports of L knee pain with activity  10/26- no limitations with walking.     PT LONG TERM GOAL #3   Title Pt. will demonstrated full L knee ROM = R knee ROM (0-145) to improve gait mechanics.    Baseline 5-130    Time 6    Period Weeks    Status Partially Met   07/02/21- 0-143 deg     PT LONG TERM GOAL #4   Title Patient will be able to return to all activities without limitation.    Time 6    Period Weeks    Status Achieved   10/24- started riding dirt bike again with L knee brace, not limited by pain or soreness, but noted a little soreness after  11/16- no pain after dirtbiking                  Plan - 07/09/21 1150     Clinical Impression Statement [name] had made excellent progress and reports returning to all activities without limitation, including dirt biking, and playing football and basketball with family.  He has met all goals except L knee ROM - still slightly tighter compared to R knee (143 compared to 145 deg flexion) but there is no pain and he has more than enough R knee ROM for all activities.  His FOTO has improved to 89%.  Today focused on core strengthening, as he identifies his balance as being his greatest weakness currently and demonstrates poor core strength with single leg stance activities, with HEP progressed to continue working on his core/hip strength.  He is able to complete all exercises without pain.  He is ready and appropriate for discharge.    PT Frequency 2x / week    PT Duration 6 weeks    PT Treatment/Interventions ADLs/Self Care Home Management;Cryotherapy;Electrical Stimulation;Iontophoresis 44m/ml  Dexamethasone;Moist Heat;Gait training;Functional mobility training;Therapeutic activities;Therapeutic  exercise;Neuromuscular re-education;Manual techniques;Dry needling;Taping;Vasopneumatic Device;Passive range of motion;Joint Manipulations    PT Next Visit Plan discharge    PT Home Exercise Plan Access Code: [email or web address]    Consulted and Agree with Plan of Care Patient             Patient will benefit from skilled therapeutic intervention in order to improve the following deficits and impairments:  Decreased endurance, Decreased range of motion, Decreased strength, Pain, Impaired flexibility, Difficulty walking  Visit Diagnosis: Stiffness of left knee, not elsewhere classified  Acute pain of left knee  Cramp and spasm  Difficulty in walking, not elsewhere classified  Localized edema  Muscle weakness (generalized)     Problem List Patient Active Problem List   Diagnosis Date Noted   Patellar dislocation, left, subsequent encounter 05/15/2021   Retained orthopedic hardware right femur 08/15/2013   Fracture, right femur closed, shaft 01/13/2013    Rennie Natter, PT, DPT 07/09/2021, 11:56 AM  [facility] [address]  [place], [place], [address] Phone: [phone]   Fax:  [phone]  Name: [name] MRN: [id number] Date of Birth: 06/03/2001

## 2022-02-04 IMAGING — DX DG KNEE COMPLETE 4+V*L*
4 series · 4 of 4 positions shown · non-contrast
Comparison: None.

CLINICAL DATA: Trauma to the left knee.

EXAM:
LEFT KNEE - COMPLETE 4+ VIEW

[knee ap]
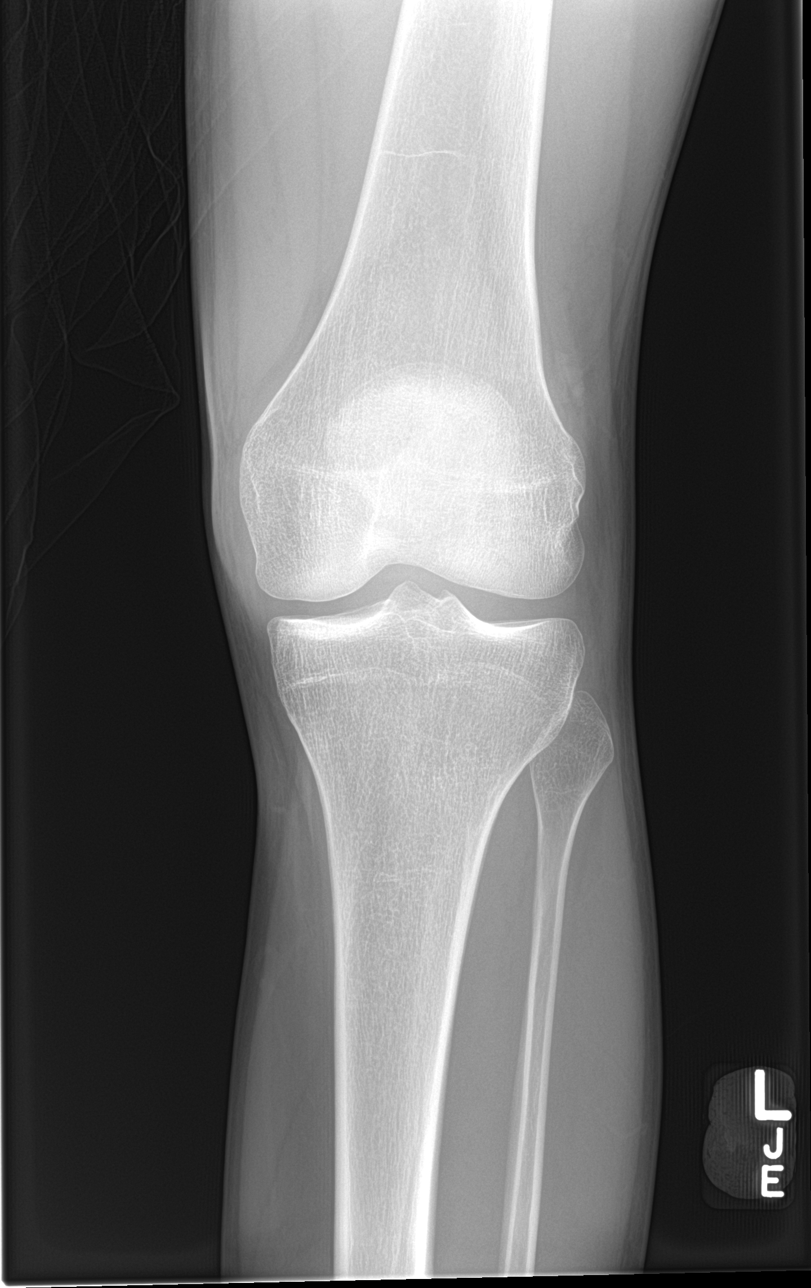

[knee lat]
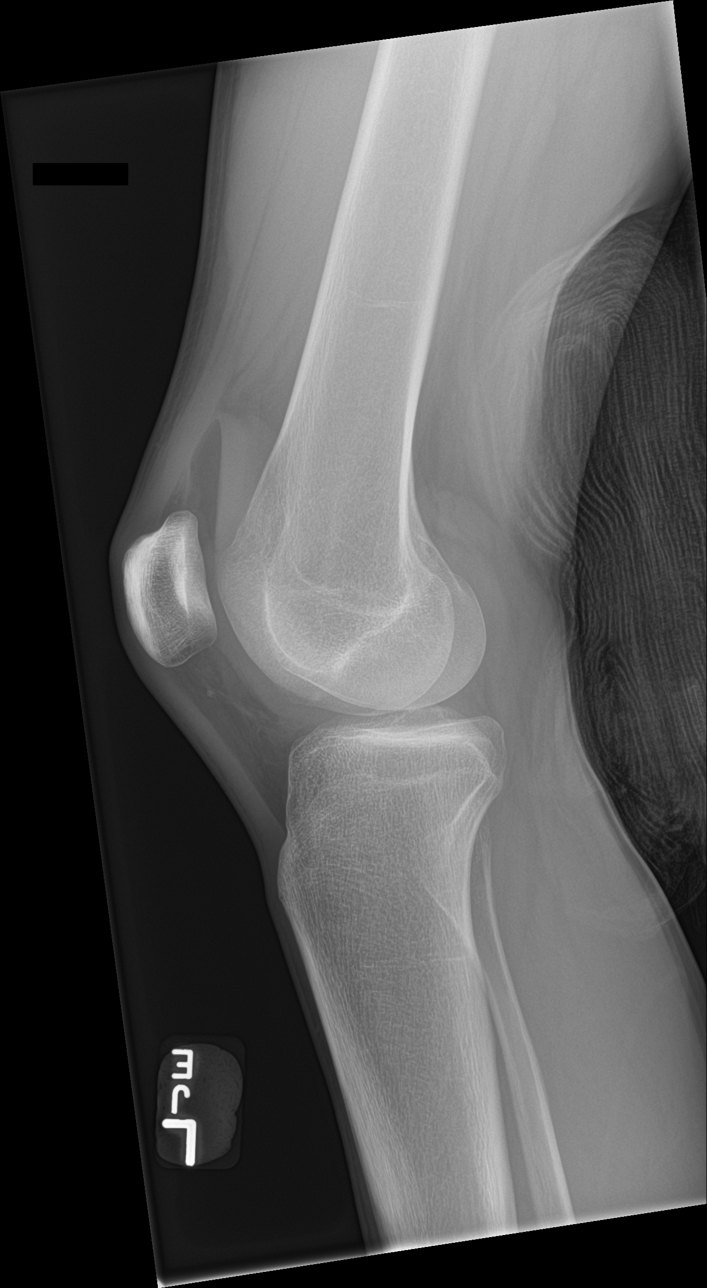

[knee obl (1 of 2)]
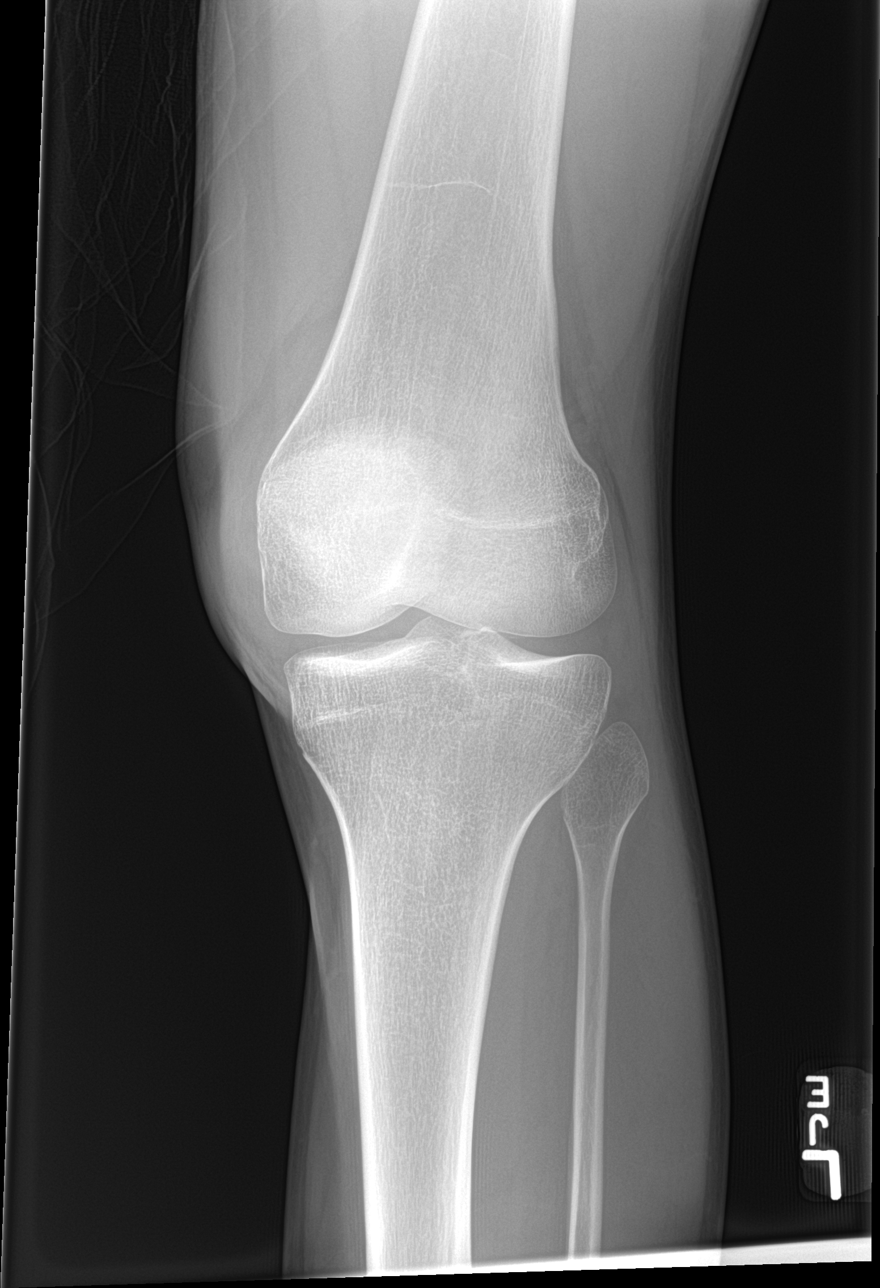

[knee obl (2 of 2)]
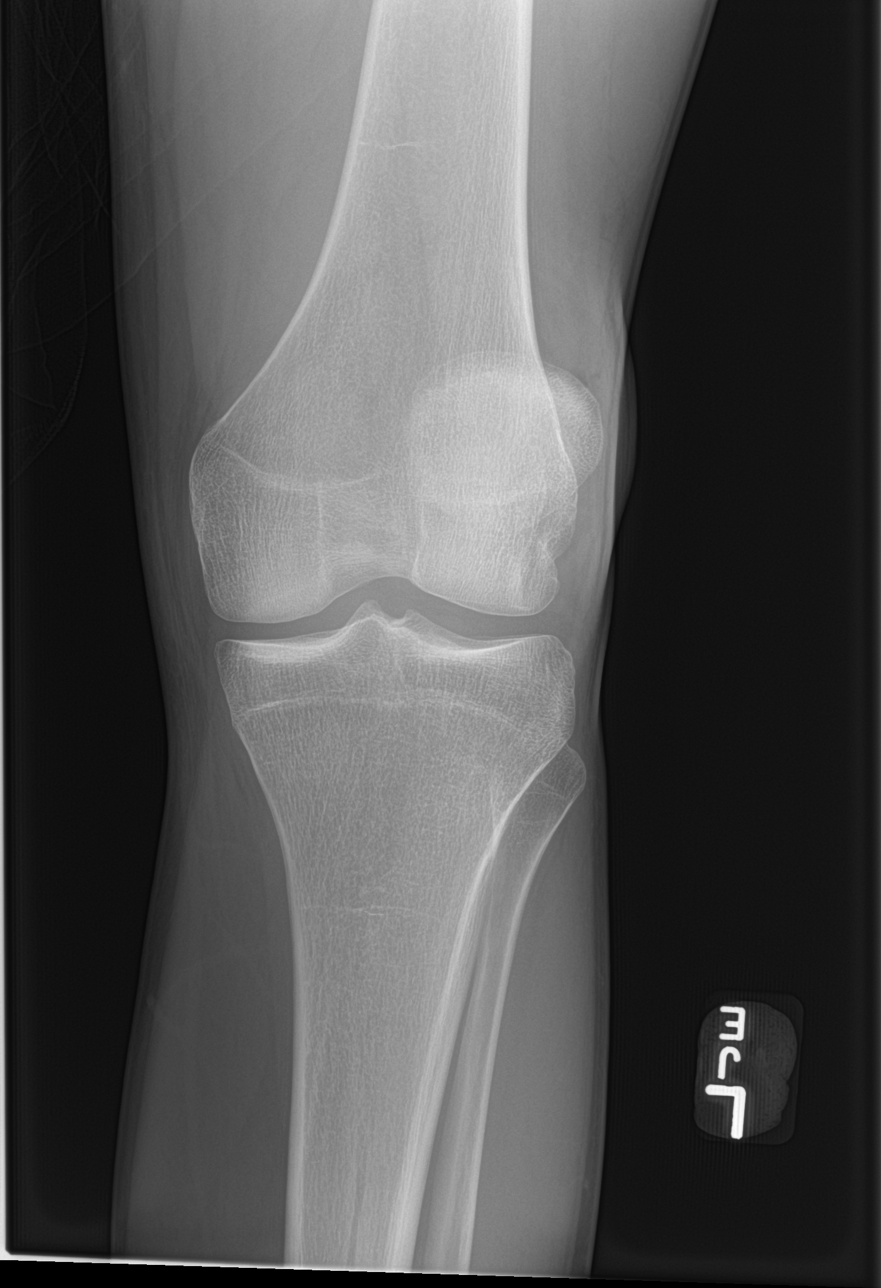

[4 of 4 positions shown; findings below may reference images not displayed]

FINDINGS: No acute fracture identified. There is however a large suprapatellar
lipohemarthrosis. Findings concerning for an occult fracture.
Clinical correlation and further evaluation with CT is recommended.
The bones are well mineralized. There is no dislocation. Mild soft
tissue swelling of the knee. No radiopaque foreign object or soft
tissue gas.
IMPRESSION: 1. No obvious acute fracture or dislocation.
2. Large suprapatellar lipohemarthrosis concerning for an occult
fracture. Clinical correlation and further evaluation with CT is
recommended.
